# Patient Record
Sex: Female | Born: 1990 | Race: Black or African American | Hispanic: No | Marital: Single | Smoking: Never smoker
Health system: Southern US, Community
[De-identification: ages and names within clinical notes are randomized; demographics above are authoritative.]

## PROBLEM LIST (undated history)

## (undated) ENCOUNTER — Inpatient Hospital Stay (HOSPITAL_COMMUNITY): Payer: Self-pay

## (undated) ENCOUNTER — Inpatient Hospital Stay (HOSPITAL_COMMUNITY)

## (undated) DIAGNOSIS — F909 Attention-deficit hyperactivity disorder, unspecified type: Secondary | ICD-10-CM

## (undated) DIAGNOSIS — Z789 Other specified health status: Secondary | ICD-10-CM

## (undated) HISTORY — PX: NO PAST SURGERIES: SHX2092

---

## 2012-10-12 ENCOUNTER — Other Ambulatory Visit: Payer: Self-pay

## 2012-10-12 ENCOUNTER — Other Ambulatory Visit (HOSPITAL_COMMUNITY): Payer: Self-pay | Admitting: Obstetrics and Gynecology

## 2012-10-12 DIAGNOSIS — Z3682 Encounter for antenatal screening for nuchal translucency: Secondary | ICD-10-CM

## 2012-10-31 ENCOUNTER — Ambulatory Visit (HOSPITAL_COMMUNITY)
Admission: RE | Admit: 2012-10-31 | Discharge: 2012-10-31 | Disposition: A | Discharge status: Home / Self Care | Source: Ambulatory Visit | Attending: Obstetrics and Gynecology | Admitting: Obstetrics and Gynecology

## 2012-10-31 ENCOUNTER — Other Ambulatory Visit: Payer: Self-pay

## 2012-10-31 ENCOUNTER — Encounter (HOSPITAL_COMMUNITY): Payer: Self-pay

## 2012-10-31 DIAGNOSIS — O351XX0 Maternal care for (suspected) chromosomal abnormality in fetus, not applicable or unspecified: Secondary | ICD-10-CM | POA: Insufficient documentation

## 2012-10-31 DIAGNOSIS — Z3689 Encounter for other specified antenatal screening: Secondary | ICD-10-CM | POA: Insufficient documentation

## 2012-10-31 DIAGNOSIS — O3510X0 Maternal care for (suspected) chromosomal abnormality in fetus, unspecified, not applicable or unspecified: Secondary | ICD-10-CM | POA: Insufficient documentation

## 2012-10-31 DIAGNOSIS — Z3682 Encounter for antenatal screening for nuchal translucency: Secondary | ICD-10-CM

## 2012-10-31 LAB — OB RESULTS CONSOLE RPR: RPR: NONREACTIVE

## 2012-10-31 LAB — OB RESULTS CONSOLE GC/CHLAMYDIA: Gonorrhea: NEGATIVE

## 2012-10-31 LAB — OB RESULTS CONSOLE HIV ANTIBODY (ROUTINE TESTING): HIV: NONREACTIVE

## 2012-10-31 NOTE — Progress Notes (Signed)
Ms. Donna Lambert had an ultrasound appointment today.  Please see [facility] report for details.  Impression Donna Lambert, intrauterine pregnancy at 12w 5d on first trimester evaluation with limited morphologic survey that is appropriate for the assigned gestational age. The nuchal translucency measures 1.3 mm, which is normal for the gestational age. The nasal bone is seen on today's evaluation and recorded as present on the risk assessment.   Recommendations Regardless of the results of today's evaluation, completion of the first trimester screen analytes is both recommended and arranged for your patient. I also recommend a second trimester AFP to occur around 15-[redacted] weeks gestational age. Anatomy scan has been previously scheduled to occur at around [redacted] weeks gestation.  Rogelia Boga, MD

## 2012-11-01 NOTE — L&D Delivery Note (Signed)
Pt was admitted in labor. She rapidly completed the first stage. She had an epidural. She pushed for 10 mins and had a SVD over a first degree midline tear in the ROA position. Placenta S/I. Baby to NBN. EBL-400cc. Tear and right labial laceration repaired with 3-0 Chromic.

## 2013-05-13 ENCOUNTER — Inpatient Hospital Stay (HOSPITAL_COMMUNITY)
Admission: AD | Admit: 2013-05-13 | Discharge: 2013-05-13 | Disposition: A | Discharge status: Home / Self Care | Source: Ambulatory Visit | Attending: Obstetrics and Gynecology | Admitting: Obstetrics and Gynecology

## 2013-05-13 DIAGNOSIS — O479 False labor, unspecified: Secondary | ICD-10-CM | POA: Insufficient documentation

## 2013-05-13 NOTE — Progress Notes (Signed)
Dr. Dareen Piano notified of patients arrival. Cervix is 2,50, Posterior. Orders received to send the patient home.

## 2013-05-13 NOTE — MAU Note (Signed)
Pt reports having ctx on and off for the past 2 hrs. Denies SROm or bleeding and reports good fetal movment

## 2013-05-14 ENCOUNTER — Encounter (HOSPITAL_COMMUNITY): Payer: Self-pay | Admitting: *Deleted

## 2013-05-14 ENCOUNTER — Encounter (HOSPITAL_COMMUNITY): Payer: Self-pay | Admitting: Anesthesiology

## 2013-05-14 ENCOUNTER — Inpatient Hospital Stay (HOSPITAL_COMMUNITY)
Admission: AD | Admit: 2013-05-14 | Discharge: 2013-05-16 | DRG: 775 | Disposition: A | Discharge status: Home / Self Care | Source: Ambulatory Visit | Attending: Obstetrics and Gynecology | Admitting: Obstetrics and Gynecology

## 2013-05-14 ENCOUNTER — Inpatient Hospital Stay (HOSPITAL_COMMUNITY): Admitting: Anesthesiology

## 2013-05-14 HISTORY — DX: Other specified health status: Z78.9

## 2013-05-14 LAB — CBC
HCT: 35.6 % — ABNORMAL LOW (ref 36.0–46.0)
MCV: 90.6 fL (ref 78.0–100.0)
RBC: 3.93 MIL/uL (ref 3.87–5.11)
WBC: 14.5 10*3/uL — ABNORMAL HIGH (ref 4.0–10.5)

## 2013-05-14 LAB — POCT FERN TEST: POCT Fern Test: NEGATIVE

## 2013-05-14 MED ORDER — IBUPROFEN 600 MG PO TABS
600.0000 mg | ORAL_TABLET | Freq: Four times a day (QID) | ORAL | Status: DC
  Administered 2013-05-14 – 2013-05-16 (×3): 600 mg via ORAL
  Filled 2013-05-14 (×5): qty 1

## 2013-05-14 MED ORDER — PHENYLEPHRINE 40 MCG/ML (10ML) SYRINGE FOR IV PUSH (FOR BLOOD PRESSURE SUPPORT)
80.0000 ug | PREFILLED_SYRINGE | INTRAVENOUS | Status: DC | PRN
  Filled 2013-05-14: qty 5
  Filled 2013-05-14: qty 2

## 2013-05-14 MED ORDER — DIBUCAINE 1 % RE OINT: 1.0000 "application " | TOPICAL_OINTMENT | RECTAL | Status: DC | PRN

## 2013-05-14 MED ORDER — DIPHENHYDRAMINE HCL 50 MG/ML IJ SOLN: 12.5000 mg | INTRAMUSCULAR | Status: DC | PRN

## 2013-05-14 MED ORDER — ACETAMINOPHEN 325 MG PO TABS
325.0000 mg | ORAL_TABLET | ORAL | Status: DC | PRN
  Administered 2013-05-14 – 2013-05-16 (×2): 325 mg via ORAL
  Filled 2013-05-14 (×2): qty 2

## 2013-05-14 MED ORDER — BENZOCAINE-MENTHOL 20-0.5 % EX AERO
1.0000 "application " | INHALATION_SPRAY | CUTANEOUS | Status: DC | PRN
  Administered 2013-05-16: 1 via TOPICAL
  Filled 2013-05-14: qty 56

## 2013-05-14 MED ORDER — OXYCODONE-ACETAMINOPHEN 5-325 MG PO TABS: 1.0000 | ORAL_TABLET | ORAL | Status: DC | PRN

## 2013-05-14 MED ORDER — PRENATAL MULTIVITAMIN CH
1.0000 | ORAL_TABLET | Freq: Every day | ORAL | Status: DC
  Administered 2013-05-14 – 2013-05-15 (×2): 1 via ORAL
  Filled 2013-05-14 (×2): qty 1

## 2013-05-14 MED ORDER — LACTATED RINGERS IV SOLN
INTRAVENOUS | Status: DC
  Administered 2013-05-14: 02:00:00 via INTRAVENOUS

## 2013-05-14 MED ORDER — MEASLES, MUMPS & RUBELLA VAC ~~LOC~~ INJ
0.5000 mL | INJECTION | Freq: Once | SUBCUTANEOUS | Status: DC
  Filled 2013-05-14: qty 0.5

## 2013-05-14 MED ORDER — FENTANYL 2.5 MCG/ML BUPIVACAINE 1/10 % EPIDURAL INFUSION (WH - ANES)
14.0000 mL/h | INTRAMUSCULAR | Status: DC | PRN
  Filled 2013-05-14: qty 125

## 2013-05-14 MED ORDER — LIDOCAINE HCL (PF) 1 % IJ SOLN
INTRAMUSCULAR | Status: DC | PRN
  Administered 2013-05-14: 3 mL
  Administered 2013-05-14 (×2): 5 mL

## 2013-05-14 MED ORDER — ONDANSETRON HCL 4 MG PO TABS: 4.0000 mg | ORAL_TABLET | ORAL | Status: DC | PRN

## 2013-05-14 MED ORDER — LACTATED RINGERS IV SOLN: 500.0000 mL | INTRAVENOUS | Status: DC | PRN

## 2013-05-14 MED ORDER — FLEET ENEMA 7-19 GM/118ML RE ENEM: 1.0000 | ENEMA | RECTAL | Status: DC | PRN

## 2013-05-14 MED ORDER — LIDOCAINE HCL (PF) 1 % IJ SOLN
30.0000 mL | INTRAMUSCULAR | Status: AC | PRN
  Administered 2013-05-14: 30 mL via SUBCUTANEOUS
  Filled 2013-05-14 (×2): qty 30

## 2013-05-14 MED ORDER — SENNOSIDES-DOCUSATE SODIUM 8.6-50 MG PO TABS
2.0000 | ORAL_TABLET | Freq: Every day | ORAL | Status: DC
  Administered 2013-05-15: 2 via ORAL

## 2013-05-14 MED ORDER — OXYTOCIN 40 UNITS IN LACTATED RINGERS INFUSION - SIMPLE MED
62.5000 mL/h | INTRAVENOUS | Status: DC
  Filled 2013-05-14: qty 1000

## 2013-05-14 MED ORDER — TETANUS-DIPHTH-ACELL PERTUSSIS 5-2.5-18.5 LF-MCG/0.5 IM SUSP
0.5000 mL | Freq: Once | INTRAMUSCULAR | Status: AC
  Administered 2013-05-15: 0.5 mL via INTRAMUSCULAR
  Filled 2013-05-14: qty 0.5

## 2013-05-14 MED ORDER — IBUPROFEN 600 MG PO TABS: 600.0000 mg | ORAL_TABLET | Freq: Four times a day (QID) | ORAL | Status: DC | PRN

## 2013-05-14 MED ORDER — WITCH HAZEL-GLYCERIN EX PADS: 1.0000 "application " | MEDICATED_PAD | CUTANEOUS | Status: DC | PRN

## 2013-05-14 MED ORDER — ONDANSETRON HCL 4 MG/2ML IJ SOLN: 4.0000 mg | INTRAMUSCULAR | Status: DC | PRN

## 2013-05-14 MED ORDER — FENTANYL 2.5 MCG/ML BUPIVACAINE 1/10 % EPIDURAL INFUSION (WH - ANES)
INTRAMUSCULAR | Status: DC | PRN
  Administered 2013-05-14: 14 mL/h via EPIDURAL

## 2013-05-14 MED ORDER — EPHEDRINE 5 MG/ML INJ
10.0000 mg | INTRAVENOUS | Status: DC | PRN
  Filled 2013-05-14: qty 2

## 2013-05-14 MED ORDER — ACETAMINOPHEN 325 MG PO TABS: 650.0000 mg | ORAL_TABLET | ORAL | Status: DC | PRN

## 2013-05-14 MED ORDER — ZOLPIDEM TARTRATE 5 MG PO TABS: 5.0000 mg | ORAL_TABLET | Freq: Every evening | ORAL | Status: DC | PRN

## 2013-05-14 MED ORDER — SIMETHICONE 80 MG PO CHEW: 80.0000 mg | CHEWABLE_TABLET | ORAL | Status: DC | PRN

## 2013-05-14 MED ORDER — LACTATED RINGERS IV SOLN
500.0000 mL | Freq: Once | INTRAVENOUS | Status: AC
  Administered 2013-05-14: 500 mL via INTRAVENOUS

## 2013-05-14 MED ORDER — PHENYLEPHRINE 40 MCG/ML (10ML) SYRINGE FOR IV PUSH (FOR BLOOD PRESSURE SUPPORT)
80.0000 ug | PREFILLED_SYRINGE | INTRAVENOUS | Status: DC | PRN
  Filled 2013-05-14: qty 2

## 2013-05-14 MED ORDER — OXYTOCIN BOLUS FROM INFUSION: 500.0000 mL | INTRAVENOUS | Status: DC

## 2013-05-14 MED ORDER — EPHEDRINE 5 MG/ML INJ
10.0000 mg | INTRAVENOUS | Status: DC | PRN
  Filled 2013-05-14: qty 2
  Filled 2013-05-14: qty 4

## 2013-05-14 MED ORDER — ONDANSETRON HCL 4 MG/2ML IJ SOLN: 4.0000 mg | Freq: Four times a day (QID) | INTRAMUSCULAR | Status: DC | PRN

## 2013-05-14 MED ORDER — CITRIC ACID-SODIUM CITRATE 334-500 MG/5ML PO SOLN: 30.0000 mL | ORAL | Status: DC | PRN

## 2013-05-14 NOTE — MAU Note (Signed)
Pt reports ROM at 2310, contractions all day

## 2013-05-14 NOTE — Anesthesia Preprocedure Evaluation (Signed)

## 2013-05-14 NOTE — Anesthesia Procedure Notes (Signed)
Epidural Patient location during procedure: OB  Staffing Anesthesiologist: Nagi Furio Performed by: anesthesiologist   Preanesthetic Checklist Completed: patient identified, site marked, surgical consent, pre-op evaluation, timeout performed, IV checked, risks and benefits discussed and monitors and equipment checked  Epidural Patient position: sitting Prep: ChloraPrep Patient monitoring: heart rate, continuous pulse ox and blood pressure Approach: midline Injection technique: LOR saline  Needle:  Needle type: Tuohy  Needle gauge: 17 G Needle length: 9 cm and 9 Needle insertion depth: 9 cm Catheter type: closed end flexible Catheter size: 20 Guage Catheter at skin depth: 15 cm Test dose: negative  Assessment Events: blood not aspirated, injection not painful, no injection resistance, negative IV test and no paresthesia  Additional Notes   Patient tolerated the insertion well without complications.   

## 2013-05-14 NOTE — Anesthesia Postprocedure Evaluation (Signed)
Anesthesia Post Note  Patient: Donna Lambert  Procedure(s) Performed: * No procedures listed *  Anesthesia type: Epidural  Patient location: Mother/Baby  Post pain: Pain level controlled  Post assessment: Post-op Vital signs reviewed  Last Vitals:  Filed Vitals:   05/14/13 0759  BP: 121/68  Pulse: 82  Temp:   Resp: 20    Post vital signs: Reviewed  Level of consciousness:alert  Complications: No apparent anesthesia complications

## 2013-05-14 NOTE — H&P (Signed)
Pt is a 22 yr old black female, G1P0 at term who presents to L&D in labor. PNC was uncomplicated. GBS-. PMHX: see [facility] PE: VSSAF         HEENT- wnl         ABD- gravid, non tender         CX-90/4/-1 vtx          FHTs- reactive IMP/IUP at term in labor. PLAN/ Admit

## 2013-05-15 LAB — CBC
Hemoglobin: 10.3 g/dL — ABNORMAL LOW (ref 12.0–15.0)
MCHC: 34.3 g/dL (ref 30.0–36.0)

## 2013-05-15 NOTE — Progress Notes (Signed)
POD#1 Pt without complaints. To  Have circ in office. Lochia-mild VSSAF IMP/ doing well PLAN/ Will discharge to home in am.

## 2013-05-16 NOTE — Discharge Summary (Signed)
Obstetric Discharge Summary Reason for Admission: onset of labor Prenatal Procedures: none Intrapartum Procedures: spontaneous vaginal delivery Postpartum Procedures: none Complications-Operative and Postpartum: 1st degree perineal laceration Hemoglobin  Date Value Range Status  05/15/2013 10.3* 12.0 - 15.0 g/dL Final     DELTA CHECK NOTED     REPEATED TO VERIFY     HCT  Date Value Range Status  05/15/2013 30.0* 36.0 - 46.0 % Final    Physical Exam:  General: alert and cooperative Lochia: appropriate Uterine Fundus: firm DVT Evaluation: No evidence of DVT seen on physical exam.  Discharge Diagnoses: Term Pregnancy-delivered  Discharge Information: Date: 05/16/2013 Activity: pelvic rest Diet: routine Medications: PNV, Ibuprofen and tylenol Condition: stable Instructions: refer to practice specific booklet Discharge to: home Follow-up Information   Follow up with Levi Aland, MD In 4 weeks.   Contact information:   [address] [place] [address] [phone]       Newborn Data: Live born female  Birth Weight: 6 lb 7 oz (2920 g) APGAR: 8, 9  Home with mother.  Philip Aspen 05/16/2013, 9:39 AM

## 2013-11-03 ENCOUNTER — Encounter (HOSPITAL_COMMUNITY): Payer: Self-pay | Admitting: Emergency Medicine

## 2013-11-03 DIAGNOSIS — R5383 Other fatigue: Secondary | ICD-10-CM

## 2013-11-03 DIAGNOSIS — R5381 Other malaise: Secondary | ICD-10-CM | POA: Insufficient documentation

## 2013-11-03 DIAGNOSIS — R112 Nausea with vomiting, unspecified: Secondary | ICD-10-CM | POA: Insufficient documentation

## 2013-11-03 DIAGNOSIS — R509 Fever, unspecified: Secondary | ICD-10-CM | POA: Insufficient documentation

## 2013-11-03 DIAGNOSIS — Z880 Allergy status to penicillin: Secondary | ICD-10-CM | POA: Insufficient documentation

## 2013-11-03 DIAGNOSIS — Z79899 Other long term (current) drug therapy: Secondary | ICD-10-CM | POA: Insufficient documentation

## 2013-11-03 DIAGNOSIS — R1012 Left upper quadrant pain: Secondary | ICD-10-CM | POA: Insufficient documentation

## 2013-11-03 DIAGNOSIS — F909 Attention-deficit hyperactivity disorder, unspecified type: Secondary | ICD-10-CM | POA: Insufficient documentation

## 2013-11-03 DIAGNOSIS — J020 Streptococcal pharyngitis: Secondary | ICD-10-CM | POA: Insufficient documentation

## 2013-11-03 DIAGNOSIS — R52 Pain, unspecified: Secondary | ICD-10-CM | POA: Insufficient documentation

## 2013-11-03 MED ORDER — ACETAMINOPHEN 650 MG RE SUPP: 650.0000 mg | RECTAL | Status: DC | PRN

## 2013-11-03 NOTE — ED Notes (Signed)
Patient with fever, cold chills and aches.  Patient is CAOx3.

## 2013-11-04 ENCOUNTER — Emergency Department (HOSPITAL_COMMUNITY)
Admission: EM | Admit: 2013-11-04 | Discharge: 2013-11-04 | Disposition: A | Discharge status: Home / Self Care | Attending: Emergency Medicine | Admitting: Emergency Medicine

## 2013-11-04 DIAGNOSIS — J020 Streptococcal pharyngitis: Secondary | ICD-10-CM

## 2013-11-04 HISTORY — DX: Attention-deficit hyperactivity disorder, unspecified type: F90.9

## 2013-11-04 LAB — POCT I-STAT, CHEM 8
BUN: 8 mg/dL (ref 6–23)
CALCIUM ION: 1.18 mmol/L (ref 1.12–1.23)
Chloride: 101 mEq/L (ref 96–112)
Creatinine, Ser: 0.8 mg/dL (ref 0.50–1.10)
Glucose, Bld: 94 mg/dL (ref 70–99)
HCT: 42 % (ref 36.0–46.0)
HEMOGLOBIN: 14.3 g/dL (ref 12.0–15.0)
Potassium: 3.9 mEq/L (ref 3.7–5.3)
SODIUM: 136 meq/L — AB (ref 137–147)
TCO2: 23 mmol/L (ref 0–100)

## 2013-11-04 LAB — CG4 I-STAT (LACTIC ACID): Lactic Acid, Venous: 2.04 mmol/L (ref 0.5–2.2)

## 2013-11-04 LAB — RAPID STREP SCREEN (MED CTR MEBANE ONLY): STREPTOCOCCUS, GROUP A SCREEN (DIRECT): POSITIVE — AB

## 2013-11-04 LAB — CBC WITH DIFFERENTIAL/PLATELET
Basophils Absolute: 0 10*3/uL (ref 0.0–0.1)
Basophils Relative: 0 % (ref 0–1)
EOS PCT: 0 % (ref 0–5)
Eosinophils Absolute: 0 10*3/uL (ref 0.0–0.7)
HEMATOCRIT: 37.8 % (ref 36.0–46.0)
HEMOGLOBIN: 12.9 g/dL (ref 12.0–15.0)
LYMPHS ABS: 1.5 10*3/uL (ref 0.7–4.0)
LYMPHS PCT: 8 % — AB (ref 12–46)
MCH: 32 pg (ref 26.0–34.0)
MCHC: 34.1 g/dL (ref 30.0–36.0)
MCV: 93.8 fL (ref 78.0–100.0)
MONO ABS: 1 10*3/uL (ref 0.1–1.0)
MONOS PCT: 5 % (ref 3–12)
Neutro Abs: 16.4 10*3/uL — ABNORMAL HIGH (ref 1.7–7.7)
Neutrophils Relative %: 86 % — ABNORMAL HIGH (ref 43–77)
Platelets: 370 10*3/uL (ref 150–400)
RBC: 4.03 MIL/uL (ref 3.87–5.11)
RDW: 12.2 % (ref 11.5–15.5)
WBC: 19 10*3/uL — AB (ref 4.0–10.5)

## 2013-11-04 LAB — COMPREHENSIVE METABOLIC PANEL
ALK PHOS: 43 U/L (ref 39–117)
ALT: 11 U/L (ref 0–35)
AST: 16 U/L (ref 0–37)
Albumin: 2.9 g/dL — ABNORMAL LOW (ref 3.5–5.2)
BILIRUBIN TOTAL: 0.8 mg/dL (ref 0.3–1.2)
BUN: 8 mg/dL (ref 6–23)
CHLORIDE: 101 meq/L (ref 96–112)
CO2: 21 meq/L (ref 19–32)
CREATININE: 0.7 mg/dL (ref 0.50–1.10)
Calcium: 8.2 mg/dL — ABNORMAL LOW (ref 8.4–10.5)
GFR calc Af Amer: >90 mL/min (ref >90)
GLUCOSE: 99 mg/dL (ref 70–99)
POTASSIUM: 3.7 meq/L (ref 3.7–5.3)
Sodium: 134 mEq/L — ABNORMAL LOW (ref 137–147)
Total Protein: 6.8 g/dL (ref 6.0–8.3)

## 2013-11-04 LAB — LIPASE, BLOOD: LIPASE: 15 U/L (ref 11–59)

## 2013-11-04 MED ORDER — DEXAMETHASONE SODIUM PHOSPHATE 10 MG/ML IJ SOLN
10.0000 mg | Freq: Once | INTRAMUSCULAR | Status: AC
  Administered 2013-11-04: 10 mg via INTRAVENOUS
  Filled 2013-11-04: qty 1

## 2013-11-04 MED ORDER — CLINDAMYCIN HCL 300 MG PO CAPS
300.0000 mg | ORAL_CAPSULE | Freq: Once | ORAL | Status: AC
  Administered 2013-11-04: 300 mg via ORAL
  Filled 2013-11-04: qty 1

## 2013-11-04 MED ORDER — ACETAMINOPHEN 500 MG PO TABS
1000.0000 mg | ORAL_TABLET | Freq: Once | ORAL | Status: AC
  Administered 2013-11-04: 1000 mg via ORAL
  Filled 2013-11-04: qty 2

## 2013-11-04 MED ORDER — CLINDAMYCIN HCL 300 MG PO CAPS: 300.0000 mg | ORAL_CAPSULE | Freq: Four times a day (QID) | ORAL | Status: DC

## 2013-11-04 MED ORDER — ONDANSETRON 4 MG PO TBDP: ORAL_TABLET | ORAL | Status: DC

## 2013-11-04 MED ORDER — ONDANSETRON HCL 4 MG/2ML IJ SOLN: 4.0000 mg | Freq: Once | INTRAMUSCULAR | Status: DC

## 2013-11-04 MED ORDER — SODIUM CHLORIDE 0.9 % IV BOLUS (SEPSIS)
1000.0000 mL | Freq: Once | INTRAVENOUS | Status: AC
  Administered 2013-11-04: 1000 mL via INTRAVENOUS

## 2013-11-04 NOTE — ED Notes (Signed)
Lactic Acid results reported to Dr.Yelverton 

## 2013-11-04 NOTE — ED Provider Notes (Signed)
CSN: [id number]     Arrival date & time 11/03/13  2348 History   First MD Initiated Contact with Patient 11/04/13 0026     Chief Complaint  Patient presents with  . Influenza   (Consider location/radiation/quality/duration/timing/severity/associated sxs/prior Treatment) HPI Patient presents with 2 days of sore throat with associated fever, chills and diffuse body aches. She has had some mild upper abdominal pain with a couple episodes of vomiting and no diarrhea. No known sick contacts. Denies headache or neck stiffness. Past Medical History  Diagnosis Date  . Medical history non-contributory   . ADHD (attention deficit hyperactivity disorder)    Past Surgical History  Procedure Laterality Date  . No past surgeries     Family History  Problem Relation Age of Onset  . Hypertension Mother   . Diabetes Father   . Hypertension Father   . Cancer Maternal Grandfather    History  Substance Use Topics  . Smoking status: Never Smoker   . Smokeless tobacco: Not on file  . Alcohol Use: No   OB History   Grav Para Term Preterm Abortions TAB SAB Ect Mult Living   1 1 1       1      Review of Systems  Constitutional: Positive for fever, chills and fatigue.  HENT: Positive for sore throat. Negative for congestion and sinus pressure.   Eyes: Negative for visual disturbance.  Respiratory: Negative for cough and shortness of breath.   Cardiovascular: Negative for chest pain, palpitations and leg swelling.  Gastrointestinal: Positive for nausea, vomiting and abdominal pain. Negative for diarrhea and constipation.  Genitourinary: Negative for dysuria, flank pain and difficulty urinating.  Musculoskeletal: Positive for myalgias. Negative for back pain, neck pain and neck stiffness.  Skin: Negative for rash and wound.  Neurological: Negative for dizziness, syncope, weakness, light-headedness, numbness and headaches.  All other systems reviewed and are negative.    Allergies  Ibuprofen  and Amoxicillin  Home Medications   Current Outpatient Rx  Name  Route  Sig  Dispense  Refill  . acetaminophen (TYLENOL) 500 MG tablet   Oral   Take 500 mg by mouth every 6 (six) hours as needed for mild pain.         Marland Kitchen. amphetamine-dextroamphetamine (ADDERALL) 20 MG tablet   Oral   Take 20 mg by mouth daily.          BP 136/106  Pulse 115  Temp(Src) 102.4 F (39.1 C) (Oral)  Resp 20  SpO2 100%  LMP 10/04/2013 Physical Exam  Nursing note and vitals reviewed. Constitutional: She is oriented to person, place, and time. She appears well-developed and well-nourished. No distress.  HENT:  Head: Normocephalic and atraumatic.  Mouth/Throat: Oropharyngeal exudate present.  Bilateral enlarged tonsils with exudate. Midline uvula. No sinus tenderness.  Eyes: EOM are normal. Pupils are equal, round, and reactive to light.  Neck: Normal range of motion. Neck supple.  No meningismus. Anterior cervical adenopathy.  Cardiovascular: Normal rate and regular rhythm.   Pulmonary/Chest: Effort normal and breath sounds normal. No respiratory distress. She has no wheezes. She has no rales. She exhibits no tenderness.  Abdominal: Soft. Bowel sounds are normal. She exhibits no distension and no mass. There is tenderness (minimal left upper quadrant tenderness to palpation.). There is no rebound and no guarding.  Musculoskeletal: Normal range of motion. She exhibits no edema and no tenderness.  No calf swelling or tenderness.  Lymphadenopathy:    She has cervical adenopathy.  Neurological: She  is alert and oriented to person, place, and time.  Patient is alert and oriented x3 with clear, goal oriented speech. Patient has 5/5 motor in all extremities. Sensation is intact to light touch. Patient has a normal gait and walks without assistance.   Skin: Skin is warm and dry. No rash noted. No erythema.  Psychiatric: She has a normal mood and affect. Her behavior is normal.    ED Course  Procedures  (including critical care time) Labs Review Labs Reviewed  CBC WITH DIFFERENTIAL - Abnormal; Notable for the following:    WBC 19.0 (*)    Neutrophils Relative % 86 (*)    Neutro Abs 16.4 (*)    Lymphocytes Relative 8 (*)    All other components within normal limits  POCT I-STAT, CHEM 8 - Abnormal; Notable for the following:    Sodium 136 (*)    All other components within normal limits  RAPID STREP SCREEN  COMPREHENSIVE METABOLIC PANEL  PREGNANCY, URINE  URINALYSIS, ROUTINE W REFLEX MICROSCOPIC  CG4 I-STAT (LACTIC ACID)   Imaging Review No results found.  EKG Interpretation   None       MDM    Patient says she's feeling much better at to the IV fluids. She's had no further vomiting in the emergency department. Vital signs have improved with IV fluid and antipyretic. We'll treat for strep throat. Patient is advised to return to the emergency department for worsening pain, inability swallow, difficulty breathing or for any concerns. Patient's voice understanding and agrees with plan.  Loren Racer, MD 11/04/13 [phone]

## 2013-11-04 NOTE — Discharge Instructions (Signed)
Strep Throat  Strep throat is an infection of the throat caused by a bacteria named Streptococcus pyogenes. Your caregiver may call the infection streptococcal "tonsillitis" or "pharyngitis" depending on whether there are signs of inflammation in the tonsils or back of the throat. Strep throat is most common in children aged 21 15 years during the cold months of the year, but it can occur in people of any age during any season. This infection is spread from person to person (contagious) through coughing, sneezing, or other close contact.  SYMPTOMS   · Fever or chills.  · Painful, swollen, red tonsils or throat.  · Pain or difficulty when swallowing.  · White or yellow spots on the tonsils or throat.  · Swollen, tender lymph nodes or "glands" of the neck or under the jaw.  · Red rash all over the body (rare).  DIAGNOSIS   Many different infections can cause the same symptoms. A test must be done to confirm the diagnosis so the right treatment can be given. A "rapid strep test" can help your caregiver make the diagnosis in a few minutes. If this test is not available, a light swab of the infected area can be used for a throat culture test. If a throat culture test is done, results are usually available in a day or two.  TREATMENT   Strep throat is treated with antibiotic medicine.  HOME CARE INSTRUCTIONS   · Gargle with 1 tsp of salt in 1 cup of warm water, 3 4 times per day or as needed for comfort.  · Family members who also have a sore throat or fever should be tested for strep throat and treated with antibiotics if they have the strep infection.  · Make sure everyone in your household washes their hands well.  · Do not share food, drinking cups, or personal items that could cause the infection to spread to others.  · You may need to eat a soft food diet until your sore throat gets better.  · Drink enough water and fluids to keep your urine clear or pale yellow. This will help prevent dehydration.  · Get plenty of  rest.  · Stay home from school, daycare, or work until you have been on antibiotics for 24 hours.  · Only take over-the-counter or prescription medicines for pain, discomfort, or fever as directed by your caregiver.  · If antibiotics are prescribed, take them as directed. Finish them even if you start to feel better.  SEEK MEDICAL CARE IF:   · The glands in your neck continue to enlarge.  · You develop a rash, cough, or earache.  · You cough up green, yellow-brown, or bloody sputum.  · You have pain or discomfort not controlled by medicines.  · Your problems seem to be getting worse rather than better.  SEEK IMMEDIATE MEDICAL CARE IF:   · You develop any new symptoms such as vomiting, severe headache, stiff or painful neck, chest pain, shortness of breath, or trouble swallowing.  · You develop severe throat pain, drooling, or changes in your voice.  · You develop swelling of the neck, or the skin on the neck becomes red and tender.  · You have a fever.  · You develop signs of dehydration, such as fatigue, dry mouth, and decreased urination.  · You become increasingly sleepy, or you cannot wake up completely.  Document Released: 10/15/2000 Document Revised: 10/04/2012 Document Reviewed: 12/17/2010  [employer]® Patient Information ©2014 [employer].

## 2013-12-11 ENCOUNTER — Emergency Department (HOSPITAL_COMMUNITY)
Admission: EM | Admit: 2013-12-11 | Discharge: 2013-12-11 | Disposition: A | Discharge status: Home / Self Care | Attending: Emergency Medicine | Admitting: Emergency Medicine

## 2013-12-11 ENCOUNTER — Encounter (HOSPITAL_COMMUNITY): Payer: Self-pay | Admitting: Emergency Medicine

## 2013-12-11 DIAGNOSIS — R030 Elevated blood-pressure reading, without diagnosis of hypertension: Secondary | ICD-10-CM | POA: Insufficient documentation

## 2013-12-11 DIAGNOSIS — R5381 Other malaise: Secondary | ICD-10-CM | POA: Insufficient documentation

## 2013-12-11 DIAGNOSIS — F909 Attention-deficit hyperactivity disorder, unspecified type: Secondary | ICD-10-CM | POA: Insufficient documentation

## 2013-12-11 DIAGNOSIS — R000 Tachycardia, unspecified: Secondary | ICD-10-CM | POA: Insufficient documentation

## 2013-12-11 DIAGNOSIS — J029 Acute pharyngitis, unspecified: Secondary | ICD-10-CM

## 2013-12-11 DIAGNOSIS — J069 Acute upper respiratory infection, unspecified: Secondary | ICD-10-CM | POA: Insufficient documentation

## 2013-12-11 DIAGNOSIS — Z79899 Other long term (current) drug therapy: Secondary | ICD-10-CM | POA: Insufficient documentation

## 2013-12-11 DIAGNOSIS — R5383 Other fatigue: Secondary | ICD-10-CM

## 2013-12-11 DIAGNOSIS — R630 Anorexia: Secondary | ICD-10-CM | POA: Insufficient documentation

## 2013-12-11 DIAGNOSIS — Z880 Allergy status to penicillin: Secondary | ICD-10-CM | POA: Insufficient documentation

## 2013-12-11 DIAGNOSIS — R0982 Postnasal drip: Secondary | ICD-10-CM

## 2013-12-11 LAB — RAPID STREP SCREEN (MED CTR MEBANE ONLY): STREPTOCOCCUS, GROUP A SCREEN (DIRECT): NEGATIVE

## 2013-12-11 MED ORDER — HYDROCODONE-HOMATROPINE 5-1.5 MG/5ML PO SYRP: 5.0000 mL | ORAL_SOLUTION | Freq: Four times a day (QID) | ORAL | Status: AC | PRN

## 2013-12-11 NOTE — ED Provider Notes (Signed)
CSN: [id number]     Arrival date & time 12/11/13  4098 History   First MD Initiated Contact with Patient 12/11/13 0802     Chief Complaint  Patient presents with  . Sore Throat  . Cough     (Consider location/radiation/quality/duration/timing/severity/associated sxs/prior Treatment) HPI  Patient presents to the emergency department with chief complaint of sore throat.  The patient was seen on the 11/03/2013 she was diagnosed with influenza-like illness and strep throat.  She was treated with amoxicillin at that time.  The patient states that since that time she's had continued rhinorrhea, nasal congestion and, cough, fatigue.  She denies fevers.  Patient states that over the past few days she has had increasing sore throat, difficulty swallowing, she denies any difficulty breathing.  She is able to hold down warm liquids.  She denies nausea or vomiting.  She has a denies any dental problems. Past Medical History  Diagnosis Date  . Medical history non-contributory   . ADHD (attention deficit hyperactivity disorder)    Past Surgical History  Procedure Laterality Date  . No past surgeries     Family History  Problem Relation Age of Onset  . Hypertension Mother   . Diabetes Father   . Hypertension Father   . Cancer Maternal Grandfather    History  Substance Use Topics  . Smoking status: Never Smoker   . Smokeless tobacco: Not on file  . Alcohol Use: No   OB History   Grav Para Term Preterm Abortions TAB SAB Ect Mult Living   1 1 1       1      Review of Systems  Constitutional: Positive for activity change, appetite change and fatigue. Negative for fever and chills.  HENT: Positive for congestion, postnasal drip, rhinorrhea, sinus pressure, sore throat and trouble swallowing. Negative for dental problem, drooling, ear pain, mouth sores and voice change.   Respiratory: Positive for cough. Negative for shortness of breath and wheezing.   Cardiovascular: Negative for chest pain.   Gastrointestinal: Negative for nausea, vomiting and abdominal pain.  Genitourinary: Negative for dysuria.  Musculoskeletal: Negative for neck pain and neck stiffness.  Skin: Negative for rash.  Neurological: Positive for headaches. Negative for speech difficulty, weakness and numbness.  Psychiatric/Behavioral: Negative for confusion.      Allergies  Ibuprofen and Amoxicillin  Home Medications   Current Outpatient Rx  Name  Route  Sig  Dispense  Refill  . acetaminophen (TYLENOL) 500 MG tablet   Oral   Take 500 mg by mouth every 6 (six) hours as needed for mild pain.         Marland Kitchen amphetamine-dextroamphetamine (ADDERALL) 10 MG tablet   Oral   Take 10 mg by mouth daily at 12 noon.         Marland Kitchen amphetamine-dextroamphetamine (ADDERALL) 20 MG tablet   Oral   Take 20 mg by mouth daily.         Marland Kitchen PRESCRIPTION MEDICATION   Right Eye   Place 1 drop into the right eye every 6 (six) hours as needed (pink eye).          BP 141/98  Pulse 113  Temp(Src) 97.9 F (36.6 C) (Oral)  Resp 18  Ht 5\' 8"  (1.727 m)  Wt 220 lb (99.791 kg)  BMI 33.46 kg/m2  SpO2 98%  LMP 12/07/2013 Physical Exam  Nursing note and vitals reviewed. Constitutional: She is oriented to person, place, and time. She appears well-developed and well-nourished. No distress.  HENT:  Head: Normocephalic and atraumatic.  Mouth/Throat: No oropharyngeal exudate.  Erythematous oropharynx, no exudate, no cervical lymphadenopathy  Eyes: Conjunctivae are normal. No scleral icterus.  Neck: Normal range of motion.  Cardiovascular: Normal rate, regular rhythm and normal heart sounds.  Exam reveals no gallop and no friction rub.   No murmur heard. Pulmonary/Chest: Effort normal and breath sounds normal. No respiratory distress.  Abdominal: Soft. Bowel sounds are normal. She exhibits no distension and no mass. There is no tenderness. There is no guarding.  Neurological: She is alert and oriented to person, place, and time.   Skin: Skin is warm and dry. She is not diaphoretic.    ED Course  Procedures (including critical care time) Labs Review Labs Reviewed  RAPID STREP SCREEN   Imaging Review No results found.  EKG Interpretation   None       MDM   Final diagnoses:  None    Filed Vitals:   12/11/13 0757  BP: 141/98  Pulse: 113  Temp: 97.9 F (36.6 C)  TempSrc: Oral  Resp: 18  Height: 5\' 8"  (1.727 m)  Weight: 220 lb (99.791 kg)  SpO2: 98%   Patient with slight tachycardia is mildly hypertensive.  Suspect this is secondary to poor oral intake from her throat.  She does have any pharyngeal edema.  There is some mild erythema and cobblestoning of the posterior pharynx consistent with post nasal drip.  No cervical lymphadenopathy.  Repeat a rapid strep screen secondary to her recent diagnosis.  I feel this is unlikely however considering she has cough no cervical adenopathy and is afebrile without exudate.  Pt afebrile without tonsillar exudate, negative strep. Presents with mild cervical lymphadenopathy, & dysphagia; diagnosis of viral pharyngitis. No abx indicated. DC w symptomatic tx for pain  Pt does not appear dehydrated, but did discuss importance of water rehydration. Presentation non concerning for PTA or infxn spread to soft tissue. No trismus or uvula deviation. Specific return precautions discussed. Pt able to drink water in ED without difficulty with intact air way. Recommended PCP follow up.   Arthor Captainbigail Michaelpaul Apo, PA-C 12/11/13 2213

## 2013-12-11 NOTE — ED Notes (Signed)
Pt here for cough and sore throat, treated for sore throat in January and completed course of antibiotics, pt sts she got better and now symptoms returned, but not as severe, sts feels stopped up.

## 2013-12-11 NOTE — Discharge Instructions (Signed)
Read instructions below to learn more about your diagnosis and for reasons to return to the ED. Followup with your doctor if symptoms are not improving after 3-4 days.  If you do not have a doctor use the resource guide listed below to help he find one. You may return to the emergency department if symptoms worsen, become progressive, or become more concerning.   Viral Pharyngitis  Viral pharyngitis is a viral infection that produces redness, pain, and swelling (inflammation) of the throat. Viral infections are not treated with antibiotics.  HOME CARE INSTRUCTIONS  Drink enough fluids to keep your urine clear or pale yellow.  Eat soft, cold foods such as ice cream, frozen ice pops, or gelatin dessert.  Gargle with warm salt water (1 tsp salt per 1 qt of water).  Throat lozenges  Use over the counter medications for symptomatic relief  (Tylenol for fever/pain, Motrin/Ibuprofen for swelling/pain)  To help prevent spreading viral pharyngitis to others, avoid:  Mouth-to-mouth contact with others.  Sharing utensils for eating and drinking.  Coughing around others.   SEEK IMMEDIATE MEDICAL CARE IF:  A rash develops.  Bloody substance is coughed up.  You are unable to swallow liquids or food for 24 hours.  You develop any new symptoms such as uncontrolable vomiting, severe headache, stiff neck, chest pain, or trouble breathing or swallowing.  You have severe throat pain along with drooling or voice changes.   You are unable to fully open the mouth.  Homatropine; Hydrocodone oral syrup  What is this medicine?  HYDROCODONE (hye droe KOE done) is used to help relieve cough.  This medicine may be used for other purposes; ask your health care provider or pharmacist if you have questions.  COMMON BRAND NAME(S): Hycodan, Hydromet, Hydropane, Mycodone  What should I tell my health care provider before I take this medicine?  They need to know if you have any of these conditions:  -brain tumor  -drug  abuse or addiction  -head injury  -heart disease  -if you frequently drink alcohol-containing drinks  -kidney disease  -liver disease  -lung disease, asthma, or breathing problems  -mental problems  -an allergic reaction to hydrocodone, other opioid analgesics, other medicines, foods, dyes, or preservatives  -pregnant or trying to get pregnant  -breast-feeding  How should I use this medicine?  Take this medicine by mouth with a full glass of water. Use a specially marked spoon or dropper to measure your dose. Ask your pharmacist if you do not have one. Do not use a household spoon. Follow the directions on the prescription label. Take your medicine at regular intervals. Do not take your medicine more often than directed.  Talk to your pediatrician regarding the use of this medicine in children. This medicine is not approved for use in children less than 49 years old.  Patients over 14 years old may have a stronger reaction and need a smaller dose.  Overdosage: If you think you have taken too much of this medicine contact a poison control center or emergency room at once.  NOTE: This medicine is only for you. Do not share this medicine with others.  What if I miss a dose?  If you miss a dose, take it as soon as you can. If it is almost time for your next dose, take only that dose. Do not take double or extra doses.  What may interact with this medicine?  -alcohol  -antihistamines  -MAOIs like Carbex, Eldepryl, Marplan, Nardil, and Parnate  -  medicines for depression, anxiety, or psychotic disturbances  -medicines for sleep  -muscle relaxants  -naltrexone  -narcotic medicines (opiates) for pain  -tramadol  -tricyclic antidepressants like amitriptyline, clomipramine, desipramine, doxepin, imipramine, nortriptyline, and protriptyline  This list may not describe all possible interactions. Give your health care provider a list of all the medicines, herbs, non-prescription drugs, or dietary  supplements you use. Also tell them if you smoke, drink alcohol, or use illegal drugs. Some items may interact with your medicine.  What should I watch for while using this medicine?  You may develop tolerance to this medicine if you take it for a long time. Tolerance means that you will get less cough relief with time. Tell your doctor or health care professional if you symptoms do not improve or if they get worse.  Do not suddenly stop taking your medicine because you may develop a severe reaction. Your body becomes used to the medicine. This does NOT mean you are addicted. Addiction is a behavior related to getting and using a drug for a non-medical reason. If your doctor wants you to stop the medicine, the dose will be slowly lowered over time to avoid any side effects.  You may get drowsy or dizzy. Do not drive, use machinery, or do anything that needs mental alertness until you know how this medicine affects you. Do not stand or sit up quickly, especially if you are an older patient. This reduces the risk of dizzy or fainting spells. Alcohol may interfere with the effect of this medicine. Avoid alcoholic drinks.  This medicine will cause constipation. Try to have a bowel movement at least every 2 to 3 days. If you do not have a bowel movement for 3 days, call your doctor or health care professional.  What side effects may I notice from receiving this medicine?  Side effects that you should report to your doctor or health care professional as soon as possible:  -allergic reactions like skin rash, itching or hives, swelling of the face, lips, or tongue  -breathing difficulties, wheezing  -confusion  -light headedness or fainting spells  Side effects that usually do not require medical attention (report to your doctor or health care professional if they continue or are bothersome):  -dizziness  -drowsiness  -itching  -nausea  -vomiting  This list may not describe all possible side effects. Call  your doctor for medical advice about side effects. You may report side effects to FDA at 1-800-FDA-1088.  Where should I keep my medicine?  Keep out of the reach of children. This medicine can be abused. Keep your medicine in a safe place to protect it from theft. Do not share this medicine with anyone. Selling or giving away this medicine is dangerous and against the law.  Store at room temperature between 15 and 30 degrees C (59 and 86 degrees F). Protect from light.  Throw away any unused medicine after the expiration date. Discard unused medicine and used packaging carefully. Pets and children can be harmed if they find used or lost packages.  NOTE: This sheet is a summary. It may not cover all possible information. If you have questions about this medicine, talk to your doctor, pharmacist, or health care provider.   2014, Elsevier/Gold Standard. (2011-06-28 10:04:07)   RESOURCE GUIDE  Dental Problems  Patients with Medicaid: [facility] [id number]                                           [address] Phone:  [phone]                                                  Phone:  [phone]  If unable to pay or uninsured, contact:  [facility] or [facility] to become qualified for the adult dental clinic.  Chronic Pain Problems Contact [facility]  [phone] Patients need to be referred by their primary care doctor.  Insufficient Money for Medicine Contact [employer] Way:  call "211" or [facility] [phone].  No Primary Care Doctor Call [facility]  [phone] Other agencies that provide inexpensive medical care    [facility]  [phone]    [facility]  [phone]    [facility]  [phone]    [facility]  [phone]    [facility]  [phone]    [facility]  [phone]  Psychological Services [facility]   [phone] [facility]  [phone] [facility]   [phone] (emergency services [phone])  Substance Abuse Resources [facility]  [phone] [facility] [phone] [facility] [phone] [facility] [phone] Residential & Outpatient Substance Abuse Program  [phone]  Abuse/Neglect [facility] Hotline 670-388-5300(336) ([phone] [facility] Hotline [phone] (After Hours)  Emergency Shelter [facility] [phone](336) [phone]  [facility] [phone] [facility] [phone]  MRSA Hotline #:   323-790-9744(609)774-1986    [facility]  [facility]     [employer]                          [facility] [address] [place]                       [address]      [address]  [place]                                                [place]                            [place] Phone:  [phone]                                   Phone:  [phone]                 Phone:  [phone]  [facility] Phone:  [phone]  [facility] Hotline [phone](336) [phone](336) [phone] (After Hours)

## 2013-12-13 LAB — CULTURE, GROUP A STREP

## 2013-12-13 NOTE — ED Provider Notes (Signed)
Medical screening examination/treatment/procedure(s) were performed by non-physician practitioner and as supervising physician I was immediately available for consultation/collaboration.  EKG Interpretation   None         William Angelin Cutrone, MD 12/13/13 1112 

## 2014-09-02 ENCOUNTER — Encounter (HOSPITAL_COMMUNITY): Payer: Self-pay | Admitting: Emergency Medicine
